# Patient Record
Sex: Female | Born: 1965 | Race: White | Hispanic: No | Marital: Married
Health system: Southern US, Community
[De-identification: ages and names within clinical notes are randomized; demographics above are authoritative.]

---

## 2004-05-23 ENCOUNTER — Ambulatory Visit: Payer: Self-pay | Admitting: Unknown Physician Specialty

## 2004-05-30 ENCOUNTER — Ambulatory Visit: Payer: Self-pay | Admitting: Unknown Physician Specialty

## 2005-12-04 ENCOUNTER — Ambulatory Visit: Payer: Self-pay | Admitting: Unknown Physician Specialty

## 2006-12-12 ENCOUNTER — Ambulatory Visit: Payer: Self-pay | Admitting: Unknown Physician Specialty

## 2008-01-15 ENCOUNTER — Ambulatory Visit: Payer: Self-pay | Admitting: Unknown Physician Specialty

## 2009-03-03 ENCOUNTER — Ambulatory Visit: Payer: Self-pay | Admitting: Unknown Physician Specialty

## 2010-06-13 ENCOUNTER — Ambulatory Visit: Payer: Self-pay | Admitting: Unknown Physician Specialty

## 2010-11-28 ENCOUNTER — Ambulatory Visit: Payer: Self-pay | Admitting: Unknown Physician Specialty

## 2011-07-23 ENCOUNTER — Ambulatory Visit: Payer: Self-pay | Admitting: Unknown Physician Specialty

## 2012-07-01 ENCOUNTER — Ambulatory Visit: Payer: Self-pay | Admitting: Internal Medicine

## 2012-07-29 ENCOUNTER — Ambulatory Visit: Payer: Self-pay | Admitting: Internal Medicine

## 2014-06-23 ENCOUNTER — Ambulatory Visit: Payer: Self-pay | Admitting: Internal Medicine

## 2015-07-28 ENCOUNTER — Other Ambulatory Visit: Payer: Self-pay | Admitting: Internal Medicine

## 2015-07-28 DIAGNOSIS — Z1231 Encounter for screening mammogram for malignant neoplasm of breast: Secondary | ICD-10-CM

## 2015-08-08 ENCOUNTER — Ambulatory Visit
Admission: RE | Admit: 2015-08-08 | Discharge: 2015-08-08 | Disposition: A | Discharge status: Home / Self Care | Payer: BLUE CROSS/BLUE SHIELD | Source: Ambulatory Visit | Attending: Internal Medicine | Admitting: Internal Medicine

## 2015-08-08 DIAGNOSIS — Z1231 Encounter for screening mammogram for malignant neoplasm of breast: Secondary | ICD-10-CM | POA: Diagnosis not present

## 2016-09-12 ENCOUNTER — Other Ambulatory Visit: Payer: Self-pay | Admitting: Internal Medicine

## 2016-09-12 DIAGNOSIS — Z1231 Encounter for screening mammogram for malignant neoplasm of breast: Secondary | ICD-10-CM

## 2016-10-09 ENCOUNTER — Ambulatory Visit
Admission: RE | Admit: 2016-10-09 | Discharge: 2016-10-09 | Disposition: A | Discharge status: Home / Self Care | Payer: BLUE CROSS/BLUE SHIELD | Source: Ambulatory Visit | Attending: Internal Medicine | Admitting: Internal Medicine

## 2016-10-09 DIAGNOSIS — Z1231 Encounter for screening mammogram for malignant neoplasm of breast: Secondary | ICD-10-CM

## 2017-01-02 ENCOUNTER — Encounter: Payer: Self-pay | Admitting: Emergency Medicine

## 2017-01-02 ENCOUNTER — Emergency Department
Admission: EM | Admit: 2017-01-02 | Discharge: 2017-01-02 | Disposition: A | Discharge status: Home / Self Care | Payer: BLUE CROSS/BLUE SHIELD | Attending: Emergency Medicine | Admitting: Emergency Medicine

## 2017-01-02 DIAGNOSIS — Z203 Contact with and (suspected) exposure to rabies: Secondary | ICD-10-CM

## 2017-01-02 NOTE — ED Provider Notes (Signed)
[facility] Emergency Department Provider Note   ____________________________________________   First MD Initiated Contact with Patient 01/02/17 [phone]     (approximate)  I have reviewed the triage vital signs and the nursing notes.   HISTORY  Chief Complaint Rabies Injection    HPI Melanie Ford is a 51 y.o. female patient to ED for consideration for rabies vaccine. Patient stated there was to best finding house last night. Patient states she does not be she was bitten. Patient's status. Conflicting reports from family members and PCP about taking rabies vaccine. Patient does not want to take the vaccine unless absolutely necessary at this time.   History reviewed. No pertinent past medical history.  There are no active problems to display for this patient.   History reviewed. No pertinent surgical history.  Prior to Admission medications   Not on File    Allergies Patient has no known allergies.  Family History  Problem Relation Age of Onset  . Breast cancer Maternal Aunt     Social History Social History  Substance Use Topics  . Smoking status: Not on file  . Smokeless tobacco: Not on file  . Alcohol use Not on file    Review of Systems  Constitutional: No fever/chills Eyes: No visual changes. ENT: No sore throat. Cardiovascular: Denies chest pain. Respiratory: Denies shortness of breath. Gastrointestinal: No abdominal pain.  No nausea, no vomiting.  No diarrhea.  No constipation. Genitourinary: Negative for dysuria. Musculoskeletal: Negative for back pain. Skin: Negative for rash. Neurological: Negative for headaches, focal weakness or numbness.   ____________________________________________   PHYSICAL EXAM:  VITAL SIGNS: ED Triage Vitals  Enc Vitals Group     BP 01/02/17 0706 109/68     Pulse Rate 01/02/17 0706 63     Resp 01/02/17 0706 17     Temp 01/02/17 0706 98.8 F (37.1 C)     Temp Source 01/02/17 0706 Oral      SpO2 01/02/17 0706 98 %     Weight 01/02/17 0705 150 lb (68 kg)     Height 01/02/17 0705 5\' 8"  (1.727 m)     Head Circumference --      Peak Flow --      Pain Score --      Pain Loc --      Pain Edu? --      Excl. in GC? --     Constitutional: Alert and oriented. Well appearing and in no acute distress. Eyes: Conjunctivae are normal. PERRL. EOMI. Head: Atraumatic. Nose: No congestion/rhinnorhea. Mouth/Throat: Mucous membranes are moist.  Oropharynx non-erythematous. Neck: No stridor.   Hematological/Lymphatic/Immunilogical: No cervical lymphadenopathy. Cardiovascular: Normal rate, regular rhythm. Grossly normal heart sounds.  Good peripheral circulation. Respiratory: Normal respiratory effort.  No retractions. Lungs CTAB. Musculoskeletal: No lower extremity tenderness nor edema.  No joint effusions. Neurologic:  Normal speech and language. No gross focal neurologic deficits are appreciated. No gait instability. Skin:  Skin is warm, dry and intact. No rash noted. Psychiatric: Mood and affect are normal. Speech and behavior are normal.  ____________________________________________   LABS (all labs ordered are listed, but only abnormal results are displayed)  Labs Reviewed - No data to display ____________________________________________  EKG   ____________________________________________  RADIOLOGY  No results found.  ____________________________________________   PROCEDURES  Procedure(s) performed: None  Procedures  Critical Care performed: No  ____________________________________________   INITIAL IMPRESSION / ASSESSMENT AND PLAN / ED COURSE  Pertinent labs & imaging results that were  available during my care of the patient were reviewed by me and considered in my medical decision making (see chart for details).  Discussed with patient that she is low risk for rabies. Advised mother to health condition for the next week and return by the ED if any  signs of illness develop. Patient is amenable to these recommendations at this time.      ____________________________________________   FINAL CLINICAL IMPRESSION(S) / ED DIAGNOSES  Final diagnoses:  Rabies exposure      NEW MEDICATIONS STARTED DURING THIS VISIT:  New Prescriptions   No medications on file     Note:  This document was prepared using Dragon voice recognition software and may include unintentional dictation errors.    Joni ReiningSmith, Yaneliz Radebaugh K, PA-C 01/02/17 [id number]    Minna AntisPaduchowski, Kevin, MD 01/02/17 [phone]

## 2017-01-02 NOTE — Discharge Instructions (Signed)
Advised to monitor health condition from the next 7-10 days. Report back immediately to the ED if any illness develop .

## 2017-01-02 NOTE — ED Triage Notes (Signed)
Patient to ER for rabies vaccine. Patient does not believe she was bitten, but has bats in attic, had two flying in house last night.  

## 2017-09-13 ENCOUNTER — Other Ambulatory Visit: Payer: Self-pay | Admitting: Internal Medicine

## 2017-09-13 DIAGNOSIS — Z1231 Encounter for screening mammogram for malignant neoplasm of breast: Secondary | ICD-10-CM

## 2017-10-10 ENCOUNTER — Ambulatory Visit
Admission: RE | Admit: 2017-10-10 | Discharge: 2017-10-10 | Disposition: A | Discharge status: Home / Self Care | Payer: BLUE CROSS/BLUE SHIELD | Source: Ambulatory Visit | Attending: Internal Medicine | Admitting: Internal Medicine

## 2017-10-10 DIAGNOSIS — Z1231 Encounter for screening mammogram for malignant neoplasm of breast: Secondary | ICD-10-CM

## 2018-07-14 IMAGING — MG MM DIGITAL SCREENING BILAT W/ CAD
4 series · 4 of 4 positions shown · non-contrast
Comparison: Previous exam(s).

CLINICAL DATA: Screening.

EXAM:
DIGITAL SCREENING BILATERAL MAMMOGRAM WITH CAD

[L CC]
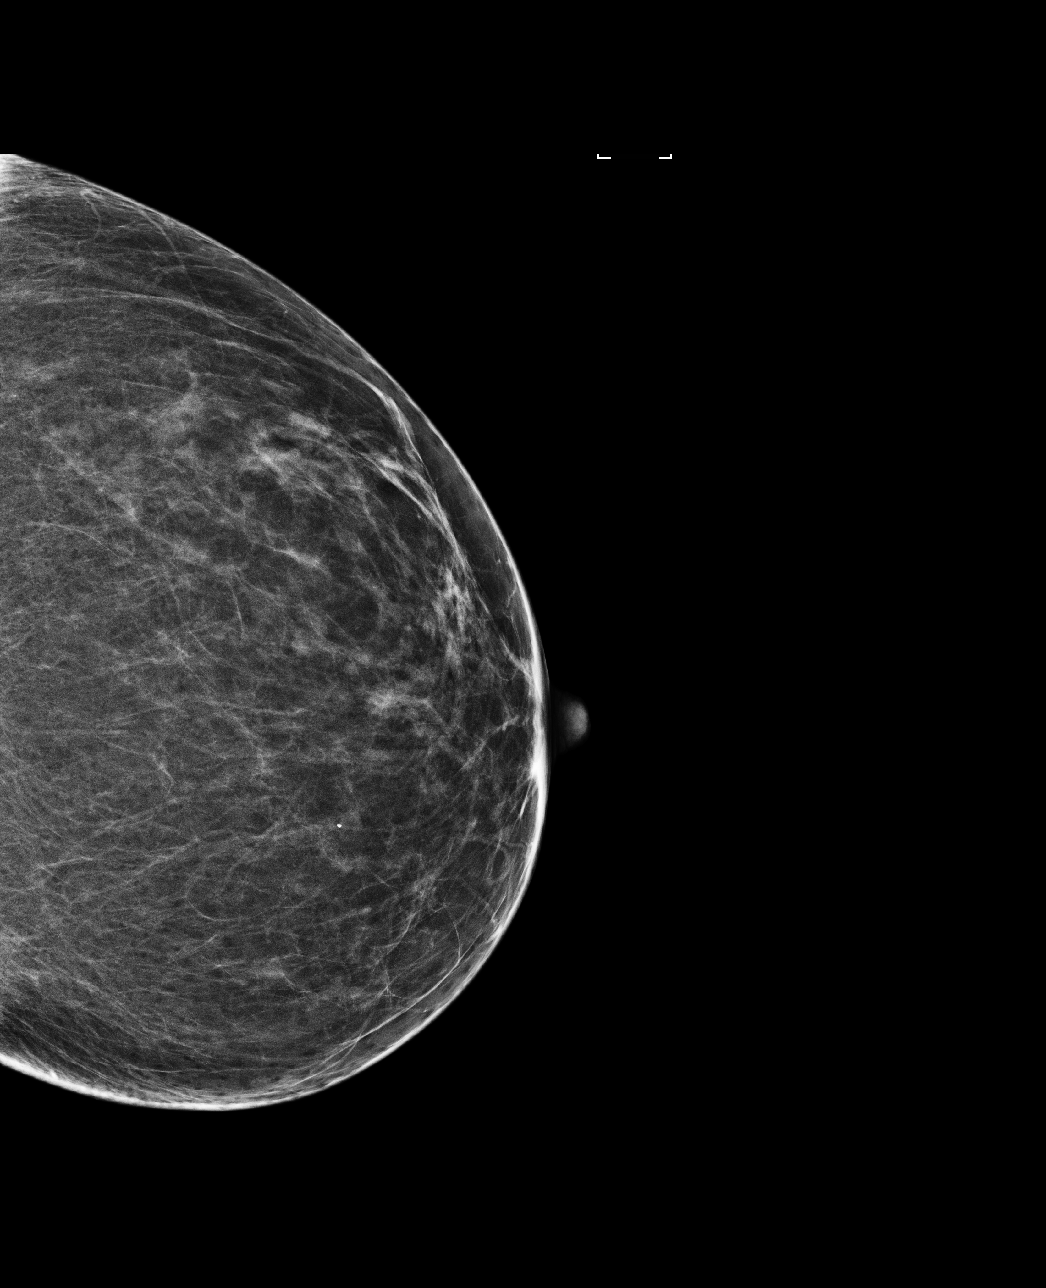

[R MLO]
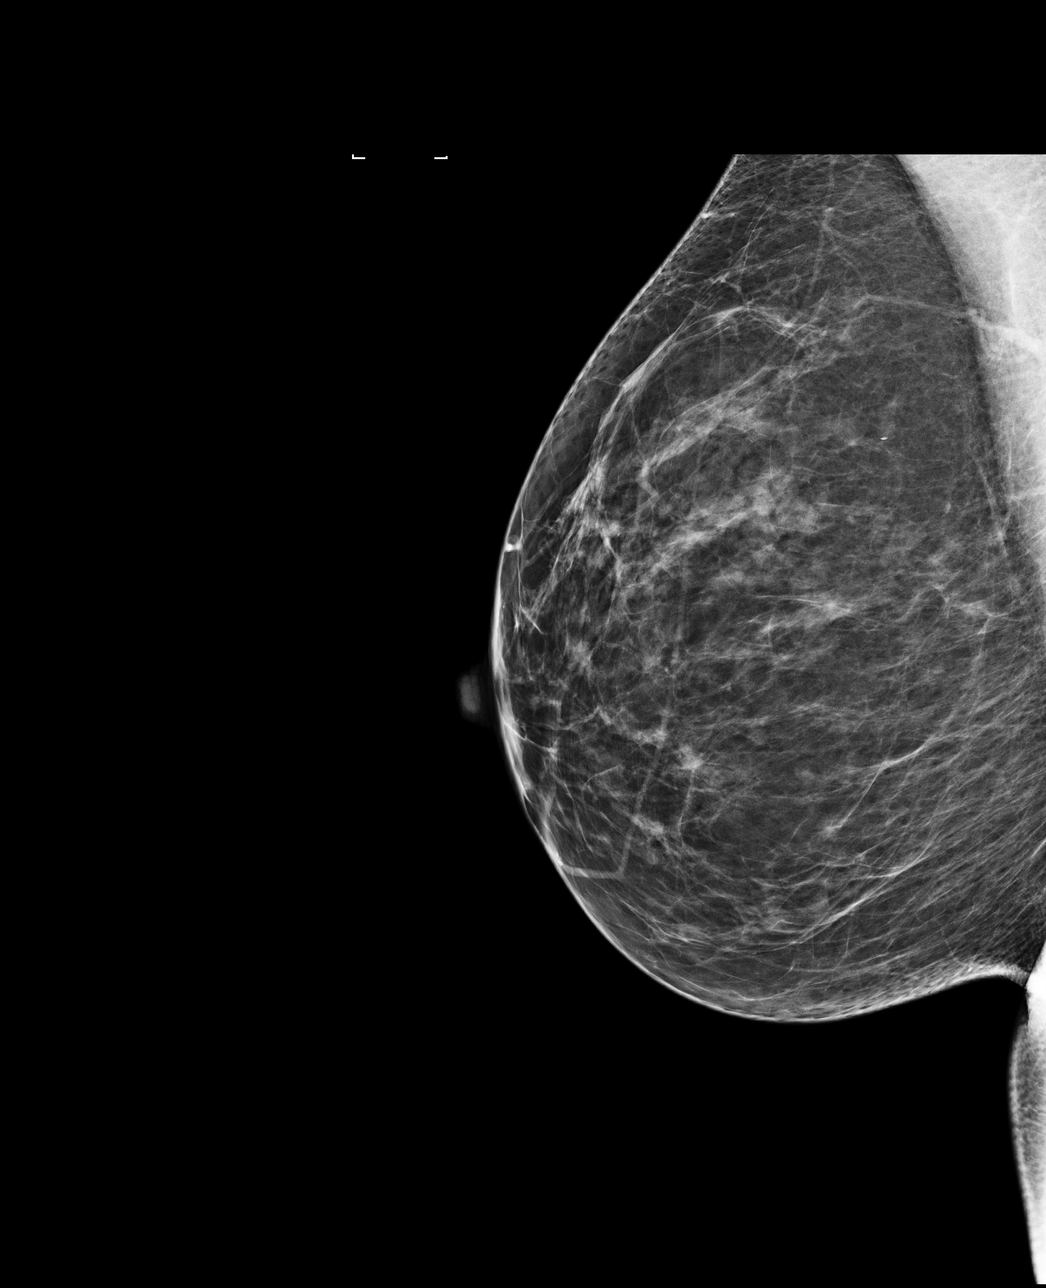

[R CC]
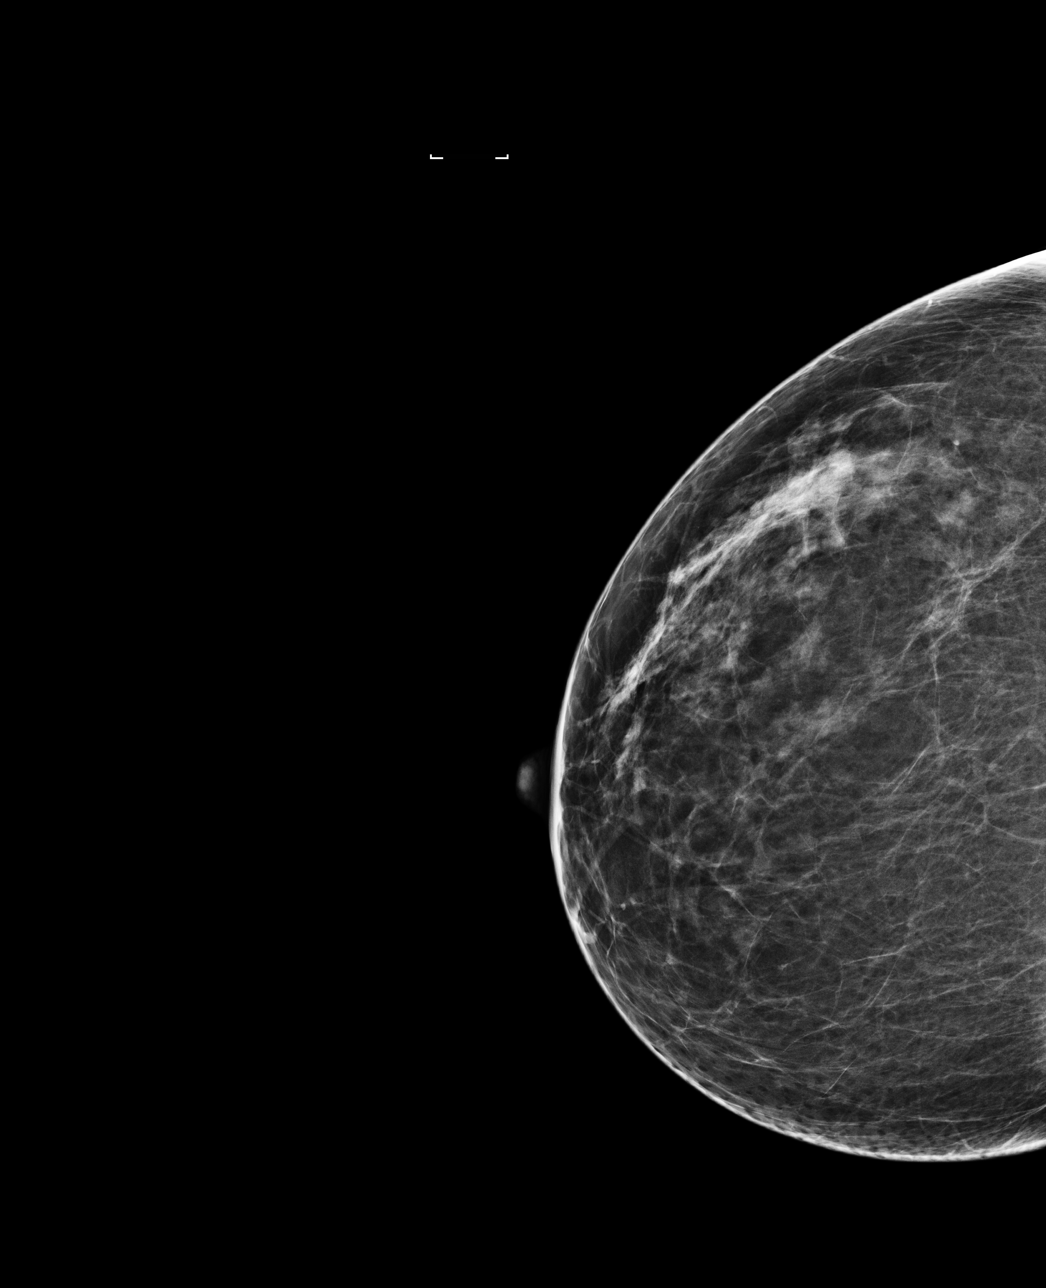

[L MLO]
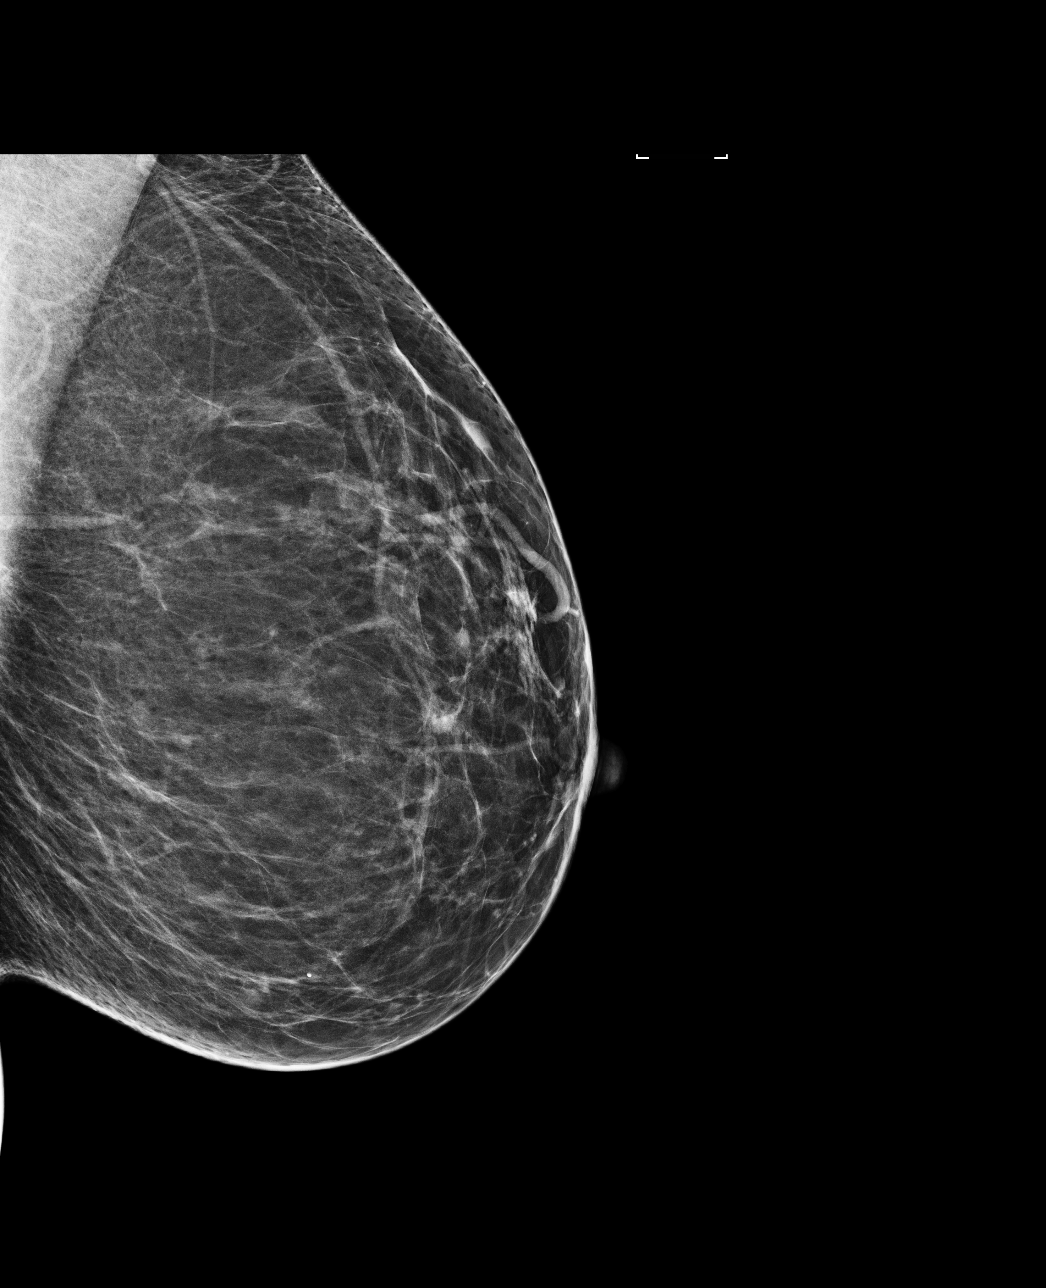

[4 of 4 positions shown; findings below may reference images not displayed]

ACR Breast Density Category c: The breast tissue is heterogeneously
dense, which may obscure small masses.
FINDINGS: There are no findings suspicious for malignancy. Images were
processed with CAD.
IMPRESSION: No mammographic evidence of malignancy. A result letter of this
screening mammogram will be mailed directly to the patient.

RECOMMENDATION:
Screening mammogram in one year. (Code:[id number])

BI-RADS CATEGORY  1: Negative.

## 2018-11-06 ENCOUNTER — Other Ambulatory Visit: Payer: Self-pay | Admitting: Internal Medicine

## 2018-11-06 DIAGNOSIS — Z1231 Encounter for screening mammogram for malignant neoplasm of breast: Secondary | ICD-10-CM

## 2018-12-22 ENCOUNTER — Other Ambulatory Visit: Payer: Self-pay

## 2018-12-22 ENCOUNTER — Ambulatory Visit
Admission: RE | Admit: 2018-12-22 | Discharge: 2018-12-22 | Disposition: A | Discharge status: Home / Self Care | Payer: BC Managed Care – PPO | Source: Ambulatory Visit | Attending: Internal Medicine | Admitting: Internal Medicine

## 2018-12-22 DIAGNOSIS — Z1231 Encounter for screening mammogram for malignant neoplasm of breast: Secondary | ICD-10-CM | POA: Insufficient documentation

## 2019-07-23 ENCOUNTER — Ambulatory Visit: Payer: BC Managed Care – PPO | Attending: Internal Medicine

## 2019-07-23 DIAGNOSIS — Z20822 Contact with and (suspected) exposure to covid-19: Secondary | ICD-10-CM

## 2019-07-24 LAB — NOVEL CORONAVIRUS, NAA: SARS-CoV-2, NAA: NOT DETECTED

## 2020-01-05 ENCOUNTER — Other Ambulatory Visit: Payer: Self-pay

## 2020-01-05 ENCOUNTER — Ambulatory Visit (INDEPENDENT_AMBULATORY_CARE_PROVIDER_SITE_OTHER): Payer: Self-pay | Admitting: Dermatology

## 2020-01-05 DIAGNOSIS — L988 Other specified disorders of the skin and subcutaneous tissue: Secondary | ICD-10-CM

## 2020-01-05 NOTE — Progress Notes (Signed)
   Follow-Up Visit   Subjective  Melanie Ford is a 54 y.o. female who presents for the following: Facial Elastosis (Face, pt presents for Botox today).  The following portions of the chart were reviewed this encounter and updated as appropriate:  Allergies  Meds  Problems  Med Hx  Surg Hx  Fam Hx     Review of Systems:  No other skin or systemic complaints except as noted in HPI or Assessment and Plan.  Objective  Well appearing patient in no apparent distress; mood and affect are within normal limits.  A focused examination was performed including face. Relevant physical exam findings are noted in the Assessment and Plan.  Objective  Head - Anterior (Face): Rhytides and volume loss.   Images                   Assessment & Plan    Elastosis of skin Head - Anterior (Face)  Botox 50 units injected today as marked: - Frown Complex 27.5 units - Forehead 7.5 units - Crows Feet 7.5 units per side total of 15 units  Botox Injection - Head - Anterior (Face) Location: Frown Complex, crows feet, and forehead  Informed consent: Discussed risks (infection, pain, bleeding, bruising, swelling, allergic reaction, paralysis of nearby muscles, eyelid droop, double vision, neck weakness, difficulty breathing, headache, undesirable cosmetic result, and need for additional treatment) and benefits of the procedure, as well as the alternatives.  Informed consent was obtained.  Preparation: The area was cleansed with alcohol.  Procedure Details:  Botox was injected into the dermis with a 30-gauge needle. Pressure applied to any bleeding. Ice packs offered for swelling.  Lot Number:  [id number] Expiration:  04/2022  Total Units Injected:  50 units  Plan: Patient was instructed to remain upright for 4 hours. Patient was instructed to avoid massaging the face and avoid vigorous exercise for the rest of the day. Tylenol may be used for headache.  Allow 2 weeks before returning  to clinic for additional dosing as needed. Patient will call for any problems.   Return in about 3 months (around 04/06/2020) for 3-4 months for Botox.   I, Ardis Rowan, RMA, am acting as scribe for Armida Sans, MD .  Documentation: I have reviewed the above documentation for accuracy and completeness, and I agree with the above.  Armida Sans, MD

## 2020-01-06 ENCOUNTER — Encounter: Payer: Self-pay | Admitting: Dermatology

## 2020-02-19 ENCOUNTER — Other Ambulatory Visit: Payer: Self-pay | Admitting: Internal Medicine

## 2020-02-19 DIAGNOSIS — Z1231 Encounter for screening mammogram for malignant neoplasm of breast: Secondary | ICD-10-CM

## 2020-03-10 ENCOUNTER — Other Ambulatory Visit: Payer: Self-pay

## 2020-03-10 ENCOUNTER — Ambulatory Visit
Admission: RE | Admit: 2020-03-10 | Discharge: 2020-03-10 | Disposition: A | Discharge status: Home / Self Care | Payer: BC Managed Care – PPO | Source: Ambulatory Visit | Attending: Internal Medicine | Admitting: Internal Medicine

## 2020-03-10 DIAGNOSIS — Z1231 Encounter for screening mammogram for malignant neoplasm of breast: Secondary | ICD-10-CM | POA: Diagnosis present

## 2020-05-10 ENCOUNTER — Other Ambulatory Visit: Payer: Self-pay

## 2020-05-10 ENCOUNTER — Ambulatory Visit (INDEPENDENT_AMBULATORY_CARE_PROVIDER_SITE_OTHER): Payer: Self-pay | Admitting: Dermatology

## 2020-05-10 DIAGNOSIS — L988 Other specified disorders of the skin and subcutaneous tissue: Secondary | ICD-10-CM

## 2020-05-10 NOTE — Progress Notes (Signed)
   Follow-Up Visit   Subjective  Melanie Ford is a 54 y.o. female who presents for the following: Facial Elastosis (face, pt presents for botox, last treatment 01/23/20).  The following portions of the chart were reviewed this encounter and updated as appropriate:  Allergies  Meds  Problems  Med Hx  Surg Hx  Fam Hx     Review of Systems:  No other skin or systemic complaints except as noted in HPI or Assessment and Plan.  Objective  Well appearing patient in no apparent distress; mood and affect are within normal limits.  A focused examination was performed including face. Relevant physical exam findings are noted in the Assessment and Plan.  Objective  face: Rhytides and volume loss.   Images     Assessment & Plan  Elastosis of skin face  Botox 50 units injected today as marked: - Frown Complex 27.5 units - Forehead 7.5 units - Crows Feet 7.5 units per side total of 15 units  Intralesional injection - face Location: Frown complex, forehead, crow's feet  Informed consent: Discussed risks (infection, pain, bleeding, bruising, swelling, allergic reaction, paralysis of nearby muscles, eyelid droop, double vision, neck weakness, difficulty breathing, headache, undesirable cosmetic result, and need for additional treatment) and benefits of the procedure, as well as the alternatives.  Informed consent was obtained.  Preparation: The area was cleansed with alcohol.  Procedure Details:  Botox was injected into the dermis with a 30-gauge needle. Pressure applied to any bleeding. Ice packs offered for swelling.  Lot Number:  [id number] Expiration:  06/2022  Total Units Injected:  50  Plan: Patient was instructed to remain upright for 4 hours. Patient was instructed to avoid massaging the face and avoid vigorous exercise for the rest of the day. Tylenol may be used for headache.  Allow 2 weeks before returning to clinic for additional dosing as needed. Patient will call for  any problems.   Return for 3-15m Botox.   I, Ardis Rowan, RMA, am acting as scribe for Armida Sans, MD .  Documentation: I have reviewed the above documentation for accuracy and completeness, and I agree with the above.  Armida Sans, MD

## 2020-05-13 ENCOUNTER — Encounter: Payer: Self-pay | Admitting: Dermatology

## 2020-08-23 ENCOUNTER — Other Ambulatory Visit: Payer: Self-pay

## 2020-08-23 MED ORDER — VALACYCLOVIR HCL 1 G PO TABS: 1000.0000 mg | ORAL_TABLET | ORAL | 1 refills | Status: DC

## 2020-08-23 NOTE — Progress Notes (Signed)
Valtrex RF request from [facility]

## 2020-09-06 ENCOUNTER — Ambulatory Visit: Payer: BC Managed Care – PPO | Admitting: Dermatology

## 2020-09-27 ENCOUNTER — Other Ambulatory Visit: Payer: Self-pay

## 2020-09-27 ENCOUNTER — Ambulatory Visit (INDEPENDENT_AMBULATORY_CARE_PROVIDER_SITE_OTHER): Payer: Self-pay | Admitting: Dermatology

## 2020-09-27 ENCOUNTER — Encounter: Payer: Self-pay | Admitting: Dermatology

## 2020-09-27 DIAGNOSIS — L821 Other seborrheic keratosis: Secondary | ICD-10-CM

## 2020-09-27 DIAGNOSIS — L988 Other specified disorders of the skin and subcutaneous tissue: Secondary | ICD-10-CM

## 2020-09-27 NOTE — Progress Notes (Signed)
   Follow-Up Visit   Subjective  Melanie Ford is a 55 y.o. female who presents for the following: Facial Elastosis (Patient is here today for Botox injections). Patient has brown spots on her face that she would like treated today.   The following portions of the chart were reviewed this encounter and updated as appropriate:   Allergies  Meds  Problems  Med Hx  Surg Hx  Fam Hx     Review of Systems:  No other skin or systemic complaints except as noted in HPI or Assessment and Plan.  Objective  Well appearing patient in no apparent distress; mood and affect are within normal limits.  A focused examination was performed including the face. Relevant physical exam findings are noted in the Assessment and Plan.  Objective  Face: Rhytides and volume loss.   Images    Objective  L inf jaw x 4 (4): Stuck-on, waxy, tan-brown papule or plaque --Discussed benign etiology and prognosis.   Assessment & Plan  Elastosis of skin Face  Botox 50 units injected as marked - Frown complex 27.5 units - Crow's feet 7.5 units each  - Forehead 7.5 units   Botox Injection - Face Location: See attached image  Informed consent: Discussed risks (infection, pain, bleeding, bruising, swelling, allergic reaction, paralysis of nearby muscles, eyelid droop, double vision, neck weakness, difficulty breathing, headache, undesirable cosmetic result, and need for additional treatment) and benefits of the procedure, as well as the alternatives.  Informed consent was obtained.  Preparation: The area was cleansed with alcohol.  Procedure Details:  Botox was injected into the dermis with a 30-gauge needle. Pressure applied to any bleeding. Ice packs offered for swelling.  Lot Number:  [id number] Expiration:  11/2022  Total Units Injected:  50  Plan: Patient was instructed to remain upright for 4 hours. Patient was instructed to avoid massaging the face and avoid vigorous exercise for the rest of the  day. Tylenol may be used for headache.  Allow 2 weeks before returning to clinic for additional dosing as needed. Patient will call for any problems.   Seborrheic keratosis (4) L inf jaw x 4  Destruction of lesion - L inf jaw x 4 Complexity: simple   Destruction method: cryotherapy   Informed consent: discussed and consent obtained   Timeout:  patient name, date of birth, surgical site, and procedure verified Lesion destroyed using liquid nitrogen: Yes   Region frozen until ice ball extended beyond lesion: Yes   Outcome: patient tolerated procedure well with no complications   Post-procedure details: wound care instructions given    Return in about 3 months (around 12/27/2020) for Botox injections.  Maylene Roes, CMA, am acting as scribe for Armida Sans, MD .  Documentation: I have reviewed the above documentation for accuracy and completeness, and I agree with the above.  Armida Sans, MD

## 2020-09-27 NOTE — Patient Instructions (Signed)

## 2021-01-24 ENCOUNTER — Ambulatory Visit (INDEPENDENT_AMBULATORY_CARE_PROVIDER_SITE_OTHER): Payer: BC Managed Care – PPO | Admitting: Dermatology

## 2021-01-24 ENCOUNTER — Other Ambulatory Visit: Payer: Self-pay

## 2021-01-24 ENCOUNTER — Encounter: Payer: Self-pay | Admitting: Dermatology

## 2021-01-24 DIAGNOSIS — L988 Other specified disorders of the skin and subcutaneous tissue: Secondary | ICD-10-CM

## 2021-01-24 DIAGNOSIS — R21 Rash and other nonspecific skin eruption: Secondary | ICD-10-CM

## 2021-01-24 MED ORDER — VTAMA 1 % EX CREA: 1.0000 "application " | TOPICAL_CREAM | Freq: Every day | CUTANEOUS | 3 refills | Status: DC

## 2021-01-24 NOTE — Progress Notes (Signed)
   Follow-Up Visit   Subjective  Melanie Ford is a 55 y.o. female who presents for the following: Facial Elastosis (Botox today) and Rash (Vaginal area - had a biopsy done by Dr. Dalbert Garnet. Treating with Clobetasol and the itching has stopped but the white patch is still there).  The following portions of the chart were reviewed this encounter and updated as appropriate:   Allergies  Meds  Problems  Med Hx  Surg Hx  Fam Hx     Review of Systems:  No other skin or systemic complaints except as noted in HPI or Assessment and Plan.  Objective  Well appearing patient in no apparent distress; mood and affect are within normal limits.  A focused examination was performed including face. Relevant physical exam findings are noted in the Assessment and Plan.  Head - Anterior (Face) Rhytides and volume loss.   Vaginal area 1.0 x 1.5 cm hyperkeratotic white plaque of the left inferior labia minora    Assessment & Plan  Elastosis of skin Head - Anterior (Face)  Botox today - 50 units   27.5 units frown complex 7.5 units forehead 7.5 units crow's feet (each side)  Filling material injection - Head - Anterior (Face) Location: See attached image  Informed consent: Discussed risks (infection, pain, bleeding, bruising, swelling, allergic reaction, paralysis of nearby muscles, eyelid droop, double vision, neck weakness, difficulty breathing, headache, undesirable cosmetic result, and need for additional treatment) and benefits of the procedure, as well as the alternatives.  Informed consent was obtained.  Preparation: The area was cleansed with alcohol.  Procedure Details:  Botox was injected into the dermis with a 30-gauge needle. Pressure applied to any bleeding. Ice packs offered for swelling.  Lot Number:  [id number] Expiration:  11/2022  Total Units Injected:  50  Plan: Patient was instructed to remain upright for 4 hours. Patient was instructed to avoid massaging the face and  avoid vigorous exercise for the rest of the day. Tylenol may be used for headache.  Allow 2 weeks before returning to clinic for additional dosing as needed. Patient will call for any problems.   Rash -biopsy-proven psoriasiform and spongiotic dermatitis. Positive family history of psoriasis. Personal history of hand dermatitis and "eczema" There may also be an element of lichen simplex chronicus  Vaginal area Chronic and persistent Possible psoriasis - Biopsied by Dr. Dalbert Garnet - Positive family history of psoriasis in her father.  Psoriasis is a chronic non-curable, but treatable genetic/hereditary disease that may have other systemic features affecting other organ systems such as joints (Psoriatic Arthritis). It is associated with an increased risk of inflammatory bowel disease, heart disease, non-alcoholic fatty liver disease, and depression.    Discontinue Clobetasol. May restart if itching flares. Start Vtama cream qd - samples given today May consider intralesional steroid injection for the hyperkeratotic area if necessary in the future.  Tapinarof (VTAMA) 1 % CREA - Vaginal area Apply 1 application topically daily.  Return for 6-8 week for psoriasis, Botox in 3-4 months.   I, Joanie Coddington, CMA, am acting as scribe for Armida Sans, MD . Documentation: I have reviewed the above documentation for accuracy and completeness, and I agree with the above.  Armida Sans, MD

## 2021-01-24 NOTE — Patient Instructions (Signed)

## 2021-03-14 ENCOUNTER — Ambulatory Visit: Payer: BC Managed Care – PPO | Admitting: Dermatology

## 2021-03-14 ENCOUNTER — Other Ambulatory Visit: Payer: Self-pay

## 2021-03-14 DIAGNOSIS — Z840 Family history of diseases of the skin and subcutaneous tissue: Secondary | ICD-10-CM | POA: Diagnosis not present

## 2021-03-14 DIAGNOSIS — L308 Other specified dermatitis: Secondary | ICD-10-CM | POA: Diagnosis not present

## 2021-03-14 DIAGNOSIS — R21 Rash and other nonspecific skin eruption: Secondary | ICD-10-CM | POA: Diagnosis not present

## 2021-03-14 NOTE — Patient Instructions (Signed)

## 2021-03-14 NOTE — Progress Notes (Signed)
   Follow-Up Visit   Subjective  Melanie Ford is a 55 y.o. female who presents for the following: Psoriasis (4 weeks f/u psoriasis in the vaginal area treating with Vtama with a poor response, past treatment Clobetasol cream not much help ).  The following portions of the chart were reviewed this encounter and updated as appropriate:   Allergies  Meds  Problems  Med Hx  Surg Hx  Fam Hx     Review of Systems:  No other skin or systemic complaints except as noted in HPI or Assessment and Plan.  Objective  Well appearing patient in no apparent distress; mood and affect are within normal limits.  A focused examination was performed including face, vaginal area. Relevant physical exam findings are noted in the Assessment and Plan.  vaginal area 1.7 x 1.2cm hyperkeratotic white plaque of the left inferior medial labia minora   Assessment & Plan  Rash vaginal area Rash -biopsy-proven psoriasiform and spongiotic dermatitis. Positive family history of psoriasis. Personal history of hand dermatitis and "eczema" There may also be an element of lichen simplex chronicus  Chronic and persistent Possible psoriasis - Biopsied by Dr. Dalbert Garnet - Positive family history of psoriasis in her father.   Psoriasis is a chronic non-curable, but treatable genetic/hereditary disease that may have other systemic features affecting other organ systems such as joints (Psoriatic Arthritis). It is associated with an increased risk of inflammatory bowel disease, heart disease, non-alcoholic fatty liver disease, and depression.    Discussed treatment options ILK injections or we could remove the thicken area by shave removal today.  Pt request ILK injections   ILK 4 mg / ml Kenalog 10 mg / ml diluted with buffered lidocaine and Bupivacaine  D/C Vtama samples  Samples of Wynzora given use qd-bid prn  Intralesional injection - vaginal area Location: vaginal area   Informed Consent: Discussed risks  (infection, pain, bleeding, bruising, thinning of the skin, loss of skin pigment, lack of resolution, and recurrence of lesion) and benefits of the procedure, as well as the alternatives. Informed consent was obtained. Preparation: The area was prepared a standard fashion.  Procedure Details: An intralesional injection was performed with Kenalog 4 mg/cc. 0.4 cc in total were injected.  Total number of injections: 4  Plan: The patient was instructed on post-op care. Recommend OTC analgesia as needed for pain.  Return in about 6 weeks (around 04/25/2021) for rash.  IAngelique Holm, CMA, am acting as scribe for Armida Sans, MD .  Documentation: I have reviewed the above documentation for accuracy and completeness, and I agree with the above.  Armida Sans, MD

## 2021-03-17 ENCOUNTER — Encounter: Payer: Self-pay | Admitting: Dermatology

## 2021-04-25 ENCOUNTER — Other Ambulatory Visit: Payer: Self-pay | Admitting: Internal Medicine

## 2021-04-25 DIAGNOSIS — Z1231 Encounter for screening mammogram for malignant neoplasm of breast: Secondary | ICD-10-CM

## 2021-04-26 ENCOUNTER — Other Ambulatory Visit: Payer: Self-pay

## 2021-04-26 ENCOUNTER — Ambulatory Visit: Payer: BC Managed Care – PPO | Admitting: Dermatology

## 2021-04-26 DIAGNOSIS — L308 Other specified dermatitis: Secondary | ICD-10-CM | POA: Diagnosis not present

## 2021-04-26 DIAGNOSIS — Z840 Family history of diseases of the skin and subcutaneous tissue: Secondary | ICD-10-CM

## 2021-04-26 DIAGNOSIS — R21 Rash and other nonspecific skin eruption: Secondary | ICD-10-CM

## 2021-04-26 DIAGNOSIS — L408 Other psoriasis: Secondary | ICD-10-CM | POA: Diagnosis not present

## 2021-04-26 NOTE — Progress Notes (Signed)
   Follow-Up Visit   Subjective  Melanie Ford is a 55 y.o. female who presents for the following: Rash (2 months f/u rash/psoriasis in the vagina area, much improved, past treatment ILK injections helped, pt report no itching or irritation, treating with samples of Wynzora cream with a good response ).  The following portions of the chart were reviewed this encounter and updated as appropriate:   Allergies  Meds  Problems  Med Hx  Surg Hx  Fam Hx     Review of Systems:  No other skin or systemic complaints except as noted in HPI or Assessment and Plan.  Objective  Well appearing patient in no apparent distress; mood and affect are within normal limits.  A focused examination was performed including vagina. Relevant physical exam findings are noted in the Assessment and Plan.  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 Positive family history of psoriasis. Personal history of hand dermatitis and "eczema" There may also be an element of lichen simplex chronicus  Chronic and persistent - Biopsied by Dr. Dalbert Garnet - Positive family history of psoriasis in her father.   Start Zoryve cream qhs Continue Wynzora in am  Psoriasis is a chronic non-curable, but treatable genetic/hereditary disease that may have other systemic features affecting other organ systems such as joints (Psoriatic Arthritis). It is associated with an increased risk of inflammatory bowel disease, heart disease, non-alcoholic fatty liver disease, and depression.    Sample of Zoryve apply to skin at bedtime Cont samples of Wynzora cream apply to skin in the morning   Return if symptoms worsen or fail to improve.  IAngelique Holm, CMA, am acting as scribe  for Armida Sans, MD .  Documentation: I have reviewed the above documentation for accuracy and completeness, and I agree with the above.  Armida Sans, MD

## 2021-04-26 NOTE — Patient Instructions (Signed)

## 2021-04-27 ENCOUNTER — Encounter: Payer: Self-pay | Admitting: Dermatology

## 2021-05-16 ENCOUNTER — Ambulatory Visit
Admission: RE | Admit: 2021-05-16 | Discharge: 2021-05-16 | Disposition: A | Discharge status: Home / Self Care | Payer: BC Managed Care – PPO | Source: Ambulatory Visit | Attending: Internal Medicine | Admitting: Internal Medicine

## 2021-05-16 ENCOUNTER — Other Ambulatory Visit: Payer: Self-pay

## 2021-05-16 DIAGNOSIS — Z1231 Encounter for screening mammogram for malignant neoplasm of breast: Secondary | ICD-10-CM | POA: Insufficient documentation

## 2021-07-04 ENCOUNTER — Ambulatory Visit (INDEPENDENT_AMBULATORY_CARE_PROVIDER_SITE_OTHER): Payer: Self-pay | Admitting: Dermatology

## 2021-07-04 ENCOUNTER — Encounter: Payer: Self-pay | Admitting: Dermatology

## 2021-07-04 ENCOUNTER — Other Ambulatory Visit: Payer: Self-pay

## 2021-07-04 DIAGNOSIS — R21 Rash and other nonspecific skin eruption: Secondary | ICD-10-CM

## 2021-07-04 DIAGNOSIS — L988 Other specified disorders of the skin and subcutaneous tissue: Secondary | ICD-10-CM

## 2021-07-04 DIAGNOSIS — L408 Other psoriasis: Secondary | ICD-10-CM

## 2021-07-04 NOTE — Progress Notes (Signed)
° °  Follow-Up Visit   Subjective  Melanie Ford is a 56 y.o. female who presents for the following: Facial Elastosis (Here for Botox). Her rash of labia has cleared up.  The following portions of the chart were reviewed this encounter and updated as appropriate:  Allergies   Meds   Problems   Med Hx   Surg Hx   Fam Hx      Review of Systems: No other skin or systemic complaints except as noted in HPI or Assessment and Plan.   Objective  Well appearing patient in no apparent distress; mood and affect are within normal limits.  A focused examination was performed including face. Relevant physical exam findings are noted in the Assessment and Plan.  face Rhytides and volume loss.       Assessment & Plan  Elastosis of skin face  Botox today - 50 units    27.5 units frown complex 7.5 units forehead 7.5 units crow's feet (each side)  Botox Injection - face Location: See attached image  Informed consent: Discussed risks (infection, pain, bleeding, bruising, swelling, allergic reaction, paralysis of nearby muscles, eyelid droop, double vision, neck weakness, difficulty breathing, headache, undesirable cosmetic result, and need for additional treatment) and benefits of the procedure, as well as the alternatives.  Informed consent was obtained.  Preparation: The area was cleansed with alcohol.  Procedure Details:  Botox was injected into the dermis with a 30-gauge needle. Pressure applied to any bleeding. Ice packs offered for swelling.  Lot Number:  [id number] Expiration:  07/2023  Total Units Injected:  50  Plan: Patient was instructed to remain upright for 4 hours. Patient was instructed to avoid massaging the face and avoid vigorous exercise for the rest of the day. Tylenol may be used for headache.  Allow 2 weeks before returning to clinic for additional dosing as needed. Patient will call for any problems.   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 She was also given Zoryve cream last visit to use nightly as needed. She says she has not had to use these as is clear. She may restart these as needed if recurs.  Return in about 4 months (around 11/01/2021) for Botox.  I, Lawson Radar, CMA, am acting as scribe for Armida Sans, MD. Documentation: I have reviewed the above documentation for accuracy and completeness, and I agree with the above.  Armida Sans, MD

## 2021-07-04 NOTE — Patient Instructions (Signed)

## 2021-07-06 ENCOUNTER — Encounter: Payer: Self-pay | Admitting: Dermatology

## 2021-11-07 ENCOUNTER — Ambulatory Visit (INDEPENDENT_AMBULATORY_CARE_PROVIDER_SITE_OTHER): Payer: Self-pay | Admitting: Dermatology

## 2021-11-07 DIAGNOSIS — L821 Other seborrheic keratosis: Secondary | ICD-10-CM

## 2021-11-07 DIAGNOSIS — L988 Other specified disorders of the skin and subcutaneous tissue: Secondary | ICD-10-CM

## 2021-11-07 NOTE — Patient Instructions (Addendum)

## 2021-11-07 NOTE — Progress Notes (Signed)
   Follow-Up Visit   Subjective  Melanie Ford is a 56 y.o. female who presents for the following: Facial Elastosis (Pt here for Botox ) and Skin Problem (Remove brown spots off her face and neck ).  The following portions of the chart were reviewed this encounter and updated as appropriate:   Allergies  Meds  Problems  Med Hx  Surg Hx  Fam Hx     Review of Systems:  No other skin or systemic complaints except as noted in HPI or Assessment and Plan.  Objective  Well appearing patient in no apparent distress; mood and affect are within normal limits.  A focused examination was performed including face,neck,chest . Relevant physical exam findings are noted in the Assessment and Plan.  face Rhytides and volume loss.      neck x 3, face x 12  (15) (15) Stuck-on, waxy, tan-brown papules --Discussed benign etiology and prognosis.    Assessment & Plan  Elastosis of skin face  Botox 50 units   Frown complex 27.5 units Forehead 7.5 units  Crow's feet 7.5  x 2   Intralesional injection - face Location: See attached image  Informed consent: Discussed risks (infection, pain, bleeding, bruising, swelling, allergic reaction, paralysis of nearby muscles, eyelid droop, double vision, neck weakness, difficulty breathing, headache, undesirable cosmetic result, and need for additional treatment) and benefits of the procedure, as well as the alternatives.  Informed consent was obtained.  Preparation: The area was cleansed with alcohol.  Procedure Details:  Botox was injected into the dermis with a 30-gauge needle. Pressure applied to any bleeding. Ice packs offered for swelling.  Lot Number:  [id number] Expiration:  09/24/2023  Total Units Injected:  50  Plan: Patient was instructed to remain upright for 4 hours. Patient was instructed to avoid massaging the face and avoid vigorous exercise for the rest of the day. Tylenol may be used for headache.  Allow 2 weeks before returning to  clinic for additional dosing as needed. Patient will call for any problems.   Seborrheic keratosis (15) neck x 3, face x 12  (15)  Reassured benign age-related growth.  Recommend observation.  Discussed cryotherapy if spot(s) become irritated or inflamed.    Pt will pay out of pocket to remove benign Sks today.  Destruction of lesion - neck x 3, face x 12  (15) Complexity: simple   Destruction method: cryotherapy   Informed consent: discussed and consent obtained   Timeout:  patient name, date of birth, surgical site, and procedure verified Lesion destroyed using liquid nitrogen: Yes   Region frozen until ice ball extended beyond lesion: Yes   Outcome: patient tolerated procedure well with no complications   Post-procedure details: wound care instructions given     Return in about 3 months (around 02/07/2022) for Botox .  IAngelique Holm, CMA, am acting as scribe for Armida Sans, MD .  Documentation: I have reviewed the above documentation for accuracy and completeness, and I agree with the above.  Armida Sans, MD

## 2021-11-18 ENCOUNTER — Encounter: Payer: Self-pay | Admitting: Dermatology

## 2022-01-23 ENCOUNTER — Other Ambulatory Visit: Payer: Self-pay | Admitting: Obstetrics and Gynecology

## 2022-01-23 DIAGNOSIS — Z1231 Encounter for screening mammogram for malignant neoplasm of breast: Secondary | ICD-10-CM

## 2022-02-22 ENCOUNTER — Other Ambulatory Visit: Payer: Self-pay | Admitting: Dermatology

## 2022-03-13 ENCOUNTER — Ambulatory Visit: Payer: BC Managed Care – PPO | Admitting: Dermatology

## 2022-03-27 ENCOUNTER — Ambulatory Visit (INDEPENDENT_AMBULATORY_CARE_PROVIDER_SITE_OTHER): Payer: Self-pay | Admitting: Dermatology

## 2022-03-27 DIAGNOSIS — L988 Other specified disorders of the skin and subcutaneous tissue: Secondary | ICD-10-CM

## 2022-03-27 NOTE — Patient Instructions (Signed)
Due to recent changes in healthcare laws, you may see results of your pathology and/or laboratory studies on MyChart before the doctors have had a chance to review them. We understand that in some cases there may be results that are confusing or concerning to you. Please understand that not all results are received at the same time and often the doctors may need to interpret multiple results in order to provide you with the best plan of care or course of treatment. Therefore, we ask that you please give us 2 business days to thoroughly review all your results before contacting the office for clarification. Should we see a critical lab result, you will be contacted sooner.   If You Need Anything After Your Visit  If you have any questions or concerns for your doctor, please call our main line at [phone] and press option 4 to reach your doctor's medical assistant. If no one answers, please leave a voicemail as directed and we will return your call as soon as possible. Messages left after 4 pm will be answered the following business day.   You may also send us a message via MyChart. We typically respond to MyChart messages within 1-2 business days.  For prescription refills, please ask your pharmacy to contact our office. Our fax number is [phone].  If you have an urgent issue when the clinic is closed that cannot wait until the next business day, you can page your doctor at the number below.    Please note that while we do our best to be available for urgent issues outside of office hours, we are not available 24/7.   If you have an urgent issue and are unable to reach us, you may choose to seek medical care at your doctor's office, retail clinic, urgent care center, or emergency room.  If you have a medical emergency, please immediately call 911 or go to the emergency department.  Pager Numbers  - Dr. Kowalski: [phone]  - Dr. Moye: [phone]  - Dr. Stewart:  [phone]  In the event of inclement weather, please call our main line at [phone] for an update on the status of any delays or closures.  Dermatology Medication Tips: Please keep the boxes that topical medications come in in order to help keep track of the instructions about where and how to use these. Pharmacies typically print the medication instructions only on the boxes and not directly on the medication tubes.   If your medication is too expensive, please contact our office at [phone] option 4 or send us a message through MyChart.   We are unable to tell what your co-pay for medications will be in advance as this is different depending on your insurance coverage. However, we may be able to find a substitute medication at lower cost or fill out paperwork to get insurance to cover a needed medication.   If a prior authorization is required to get your medication covered by your insurance company, please allow us 1-2 business days to complete this process.  Drug prices often vary depending on where the prescription is filled and some pharmacies may offer cheaper prices.  The website www.goodrx.com contains coupons for medications through different pharmacies. The prices here do not account for what the cost may be with help from insurance (it may be cheaper with your insurance), but the website can give you the price if you did not use any insurance.  - You can print the associated coupon and take it with   your prescription to the pharmacy.  - You may also stop by our office during regular business hours and pick up a GoodRx coupon card.  - If you need your prescription sent electronically to a different pharmacy, notify our office through [facility] MyChart or by phone at [phone] option 4.     Si Usted Necesita Algo Despus de Su Visita  Tambin puede enviarnos un mensaje a travs de MyChart. Por lo general respondemos a los mensajes de MyChart en el transcurso de 1 a 2  das hbiles.  Para renovar recetas, por favor pida a su farmacia que se ponga en contacto con nuestra oficina. Nuestro nmero de fax es el [phone].  Si tiene un asunto urgente cuando la clnica est cerrada y que no puede esperar hasta el siguiente da hbil, puede llamar/localizar a su doctor(a) al nmero que aparece a continuacin.   Por favor, tenga en cuenta que aunque hacemos todo lo posible para estar disponibles para asuntos urgentes fuera del horario de oficina, no estamos disponibles las 24 horas del da, los 7 das de la semana.   Si tiene un problema urgente y no puede comunicarse con nosotros, puede optar por buscar atencin mdica  en el consultorio de su doctor(a), en una clnica privada, en un centro de atencin urgente o en una sala de emergencias.  Si tiene una emergencia mdica, por favor llame inmediatamente al 911 o vaya a la sala de emergencias.  Nmeros de bper  - Dr. Kowalski: [phone]  - Dra. Moye: [phone]  - Dra. Stewart: [phone]  En caso de inclemencias del tiempo, por favor llame a nuestra lnea principal al [phone] para una actualizacin sobre el estado de cualquier retraso o cierre.  Consejos para la medicacin en dermatologa: Por favor, guarde las cajas en las que vienen los medicamentos de uso tpico para ayudarle a seguir las instrucciones sobre dnde y cmo usarlos. Las farmacias generalmente imprimen las instrucciones del medicamento slo en las cajas y no directamente en los tubos del medicamento.   Si su medicamento es muy caro, por favor, pngase en contacto con nuestra oficina llamando al [phone] y presione la opcin 4 o envenos un mensaje a travs de MyChart.   No podemos decirle cul ser su copago por los medicamentos por adelantado ya que esto es diferente dependiendo de la cobertura de su seguro. Sin embargo, es posible que podamos encontrar un medicamento sustituto a menor costo o llenar un formulario para que el  seguro cubra el medicamento que se considera necesario.   Si se requiere una autorizacin previa para que su compaa de seguros cubra su medicamento, por favor permtanos de 1 a 2 das hbiles para completar este proceso.  Los precios de los medicamentos varan con frecuencia dependiendo del lugar de dnde se surte la receta y alguna farmacias pueden ofrecer precios ms baratos.  El sitio web www.goodrx.com tiene cupones para medicamentos de diferentes farmacias. Los precios aqu no tienen en cuenta lo que podra costar con la ayuda del seguro (puede ser ms barato con su seguro), pero el sitio web puede darle el precio si no utiliz ningn seguro.  - Puede imprimir el cupn correspondiente y llevarlo con su receta a la farmacia.  - Tambin puede pasar por nuestra oficina durante el horario de atencin regular y recoger una tarjeta de cupones de GoodRx.  - Si necesita que su receta se enve electrnicamente a una farmacia diferente, informe a nuestra oficina a travs de MyChart de [facility]   o por telfono llamando al [phone] y presione la opcin 4.  

## 2022-03-27 NOTE — Progress Notes (Signed)
   Follow-Up Visit   Subjective  Melanie Ford is a 56 y.o. female who presents for the following: Facial Elastosis (Patient is here today for Botox injections).  The following portions of the chart were reviewed this encounter and updated as appropriate:   Allergies  Meds  Problems  Med Hx  Surg Hx  Fam Hx     Review of Systems:  No other skin or systemic complaints except as noted in HPI or Assessment and Plan.  Objective  Well appearing patient in no apparent distress; mood and affect are within normal limits.  A focused examination was performed including the face. Relevant physical exam findings are noted in the Assessment and Plan.  Face Rhytides and volume loss.       Assessment & Plan  Elastosis of skin Face  Botox 50 units injected as marked:  - Frown complex 27.5 units - Forehead 7.5 units  - Crow's feet 7.5  x 2   Botox Injection - Face Location: See attached image  Informed consent: Discussed risks (infection, pain, bleeding, bruising, swelling, allergic reaction, paralysis of nearby muscles, eyelid droop, double vision, neck weakness, difficulty breathing, headache, undesirable cosmetic result, and need for additional treatment) and benefits of the procedure, as well as the alternatives.  Informed consent was obtained.  Preparation: The area was cleansed with alcohol.  Procedure Details:  Botox was injected into the dermis with a 30-gauge needle. Pressure applied to any bleeding. Ice packs offered for swelling.  Lot Number:  [id number] Expiration:  12/25  Total Units Injected:  50  Plan: Patient was instructed to remain upright for 4 hours. Patient was instructed to avoid massaging the face and avoid vigorous exercise for the rest of the day. Tylenol may be used for headache.  Allow 2 weeks before returning to clinic for additional dosing as needed. Patient will call for any problems.    Return in about 4 months (around 07/28/2022) for Botox  injections.  Maylene Roes, CMA, am acting as scribe for Armida Sans, MD . Documentation: I have reviewed the above documentation for accuracy and completeness, and I agree with the above.  Armida Sans, MD

## 2022-03-28 ENCOUNTER — Encounter: Payer: Self-pay | Admitting: Dermatology

## 2022-05-21 ENCOUNTER — Ambulatory Visit
Admission: RE | Admit: 2022-05-21 | Discharge: 2022-05-21 | Disposition: A | Discharge status: Home / Self Care | Payer: BC Managed Care – PPO | Source: Ambulatory Visit | Attending: Obstetrics and Gynecology | Admitting: Obstetrics and Gynecology

## 2022-05-21 DIAGNOSIS — Z1231 Encounter for screening mammogram for malignant neoplasm of breast: Secondary | ICD-10-CM

## 2022-07-31 ENCOUNTER — Ambulatory Visit (INDEPENDENT_AMBULATORY_CARE_PROVIDER_SITE_OTHER): Payer: Self-pay | Admitting: Dermatology

## 2022-07-31 VITALS — BP 101/66 | HR 60

## 2022-07-31 DIAGNOSIS — L988 Other specified disorders of the skin and subcutaneous tissue: Secondary | ICD-10-CM

## 2022-07-31 MED ORDER — TRETINOIN 0.025 % EX CREA: TOPICAL_CREAM | Freq: Every day | CUTANEOUS | 11 refills | Status: AC

## 2022-07-31 NOTE — Progress Notes (Unsigned)
   Follow-Up Visit   Subjective  Melanie Ford is a 57 y.o. female who presents for the following: Facial Elastosis (Face, pt presents for botox today, pt feels like she is getting a crease on right upper eyelid and is more noticeable after getting her botox).  The following portions of the chart were reviewed this encounter and updated as appropriate:   Allergies  Meds  Problems  Med Hx  Surg Hx  Fam Hx     Review of Systems:  No other skin or systemic complaints except as noted in HPI or Assessment and Plan.  Objective  Well appearing patient in no apparent distress; mood and affect are within normal limits.  A focused examination was performed including face. Relevant physical exam findings are noted in the Assessment and Plan.  Head - Anterior (Face) Rhytides and volume loss.       Assessment & Plan  Elastosis of skin Head - Anterior (Face)  Botox 50 units injected today to: - Frown complex 27.5 units - Forehead 7.5 units  - Crow's feet 7.5 units x 2   Start Tretinoin 0.025% qhs to face nightly  Topical retinoid medications like tretinoin/Retin-A, adapalene/Differin, tazarotene/Fabior, and Epiduo/Epiduo Forte can cause dryness and irritation when first started. Only apply a pea-sized amount to the entire affected area. Avoid applying it around the eyes, edges of mouth and creases at the nose. If you experience irritation, use a good moisturizer first and/or apply the medicine less often. If you are doing well with the medicine, you can increase how often you use it until you are applying every night. Be careful with sun protection while using this medication as it can make you sensitive to the sun. This medicine should not be used by pregnant women.    Botox Injection - Head - Anterior (Face) Location: frown complex, forehead, bil crow's feet  Informed consent: Discussed risks (infection, pain, bleeding, bruising, swelling, allergic reaction, paralysis of nearby  muscles, eyelid droop, double vision, neck weakness, difficulty breathing, headache, undesirable cosmetic result, and need for additional treatment) and benefits of the procedure, as well as the alternatives.  Informed consent was obtained.  Preparation: The area was cleansed with alcohol.  Procedure Details:  Botox was injected into the dermis with a 30-gauge needle. Pressure applied to any bleeding. Ice packs offered for swelling.  Lot Number:  [id number] Expiration:  03/26  Total Units Injected:  50  Plan: Patient was instructed to remain upright for 4 hours. Patient was instructed to avoid massaging the face and avoid vigorous exercise for the rest of the day. Tylenol may be used for headache.  Allow 2 weeks before returning to clinic for additional dosing as needed. Patient will call for any problems.   tretinoin (RETIN-A) 0.025 % cream - Head - Anterior (Face) Apply topically at bedtime. Apply a pea size amount to face nightly as tolerated   Return for 3-49m Botox.  I, Othelia Pulling, RMA, am acting as scribe for Sarina Ser, MD . Documentation: I have reviewed the above documentation for accuracy and completeness, and I agree with the above.  Sarina Ser, MD

## 2022-07-31 NOTE — Patient Instructions (Addendum)
Topical retinoid medications like tretinoin/Retin-A, adapalene/Differin, tazarotene/Fabior, and Epiduo/Epiduo Forte can cause dryness and irritation when first started. Only apply a pea-sized amount to the entire affected area. Avoid applying it around the eyes, edges of mouth and creases at the nose. If you experience irritation, use a good moisturizer first and/or apply the medicine less often. If you are doing well with the medicine, you can increase how often you use it until you are applying every night. Be careful with sun protection while using this medication as it can make you sensitive to the sun. This medicine should not be used by pregnant women.    Due to recent changes in healthcare laws, you may see results of your pathology and/or laboratory studies on MyChart before the doctors have had a chance to review them. We understand that in some cases there may be results that are confusing or concerning to you. Please understand that not all results are received at the same time and often the doctors may need to interpret multiple results in order to provide you with the best plan of care or course of treatment. Therefore, we ask that you please give us 2 business days to thoroughly review all your results before contacting the office for clarification. Should we see a critical lab result, you will be contacted sooner.   If You Need Anything After Your Visit  If you have any questions or concerns for your doctor, please call our main line at [phone] and press option 4 to reach your doctor's medical assistant. If no one answers, please leave a voicemail as directed and we will return your call as soon as possible. Messages left after 4 pm will be answered the following business day.   You may also send us a message via MyChart. We typically respond to MyChart messages within 1-2 business days.  For prescription refills, please ask your pharmacy to contact our office. Our fax number is  [phone].  If you have an urgent issue when the clinic is closed that cannot wait until the next business day, you can page your doctor at the number below.    Please note that while we do our best to be available for urgent issues outside of office hours, we are not available 24/7.   If you have an urgent issue and are unable to reach us, you may choose to seek medical care at your doctor's office, retail clinic, urgent care center, or emergency room.  If you have a medical emergency, please immediately call 911 or go to the emergency department.  Pager Numbers  - Dr. Kowalski: [phone]  - Dr. Moye: [phone]  - Dr. Stewart: [phone]  In the event of inclement weather, please call our main line at [phone] for an update on the status of any delays or closures.  Dermatology Medication Tips: Please keep the boxes that topical medications come in in order to help keep track of the instructions about where and how to use these. Pharmacies typically print the medication instructions only on the boxes and not directly on the medication tubes.   If your medication is too expensive, please contact our office at [phone] option 4 or send us a message through MyChart.   We are unable to tell what your co-pay for medications will be in advance as this is different depending on your insurance coverage. However, we may be able to find a substitute medication at lower cost or fill out paperwork to get insurance to cover a   needed medication.   If a prior authorization is required to get your medication covered by your insurance company, please allow us 1-2 business days to complete this process.  Drug prices often vary depending on where the prescription is filled and some pharmacies may offer cheaper prices.  The website www.goodrx.com contains coupons for medications through different pharmacies. The prices here do not account for what the cost may be with help from  insurance (it may be cheaper with your insurance), but the website can give you the price if you did not use any insurance.  - You can print the associated coupon and take it with your prescription to the pharmacy.  - You may also stop by our office during regular business hours and pick up a GoodRx coupon card.  - If you need your prescription sent electronically to a different pharmacy, notify our office through [facility] MyChart or by phone at [phone] option 4.     Si Usted Necesita Algo Despus de Su Visita  Tambin puede enviarnos un mensaje a travs de MyChart. Por lo general respondemos a los mensajes de MyChart en el transcurso de 1 a 2 das hbiles.  Para renovar recetas, por favor pida a su farmacia que se ponga en contacto con nuestra oficina. Nuestro nmero de fax es el [phone].  Si tiene un asunto urgente cuando la clnica est cerrada y que no puede esperar hasta el siguiente da hbil, puede llamar/localizar a su doctor(a) al nmero que aparece a continuacin.   Por favor, tenga en cuenta que aunque hacemos todo lo posible para estar disponibles para asuntos urgentes fuera del horario de oficina, no estamos disponibles las 24 horas del da, los 7 das de la semana.   Si tiene un problema urgente y no puede comunicarse con nosotros, puede optar por buscar atencin mdica  en el consultorio de su doctor(a), en una clnica privada, en un centro de atencin urgente o en una sala de emergencias.  Si tiene una emergencia mdica, por favor llame inmediatamente al 911 o vaya a la sala de emergencias.  Nmeros de bper  - Dr. Kowalski: [phone]  - Dra. Moye: [phone]  - Dra. Stewart: [phone]  En caso de inclemencias del tiempo, por favor llame a nuestra lnea principal al [phone] para una actualizacin sobre el estado de cualquier retraso o cierre.  Consejos para la medicacin en dermatologa: Por favor, guarde las cajas en las que vienen los  medicamentos de uso tpico para ayudarle a seguir las instrucciones sobre dnde y cmo usarlos. Las farmacias generalmente imprimen las instrucciones del medicamento slo en las cajas y no directamente en los tubos del medicamento.   Si su medicamento es muy caro, por favor, pngase en contacto con nuestra oficina llamando al [phone] y presione la opcin 4 o envenos un mensaje a travs de MyChart.   No podemos decirle cul ser su copago por los medicamentos por adelantado ya que esto es diferente dependiendo de la cobertura de su seguro. Sin embargo, es posible que podamos encontrar un medicamento sustituto a menor costo o llenar un formulario para que el seguro cubra el medicamento que se considera necesario.   Si se requiere una autorizacin previa para que su compaa de seguros cubra su medicamento, por favor permtanos de 1 a 2 das hbiles para completar este proceso.  Los precios de los medicamentos varan con frecuencia dependiendo del lugar de dnde se surte la receta y alguna farmacias pueden ofrecer precios ms baratos.    El sitio web www.goodrx.com tiene cupones para medicamentos de diferentes farmacias. Los precios aqu no tienen en cuenta lo que podra costar con la ayuda del seguro (puede ser ms barato con su seguro), pero el sitio web puede darle el precio si no utiliz ningn seguro.  - Puede imprimir el cupn correspondiente y llevarlo con su receta a la farmacia.  - Tambin puede pasar por nuestra oficina durante el horario de atencin regular y recoger una tarjeta de cupones de GoodRx.  - Si necesita que su receta se enve electrnicamente a una farmacia diferente, informe a nuestra oficina a travs de MyChart de [facility] o por telfono llamando al [phone] y presione la opcin 4.  

## 2022-08-01 ENCOUNTER — Encounter: Payer: Self-pay | Admitting: Dermatology

## 2022-12-04 ENCOUNTER — Ambulatory Visit: Payer: BC Managed Care – PPO | Admitting: Dermatology

## 2022-12-20 ENCOUNTER — Ambulatory Visit (INDEPENDENT_AMBULATORY_CARE_PROVIDER_SITE_OTHER): Payer: Self-pay | Admitting: Dermatology

## 2022-12-20 DIAGNOSIS — L988 Other specified disorders of the skin and subcutaneous tissue: Secondary | ICD-10-CM

## 2022-12-20 NOTE — Patient Instructions (Addendum)
Due to recent changes in healthcare laws, you may see results of your pathology and/or laboratory studies on MyChart before the doctors have had a chance to review them. We understand that in some cases there may be results that are confusing or concerning to you. Please understand that not all results are received at the same time and often the doctors may need to interpret multiple results in order to provide you with the best plan of care or course of treatment. Therefore, we ask that you please give us 2 business days to thoroughly review all your results before contacting the office for clarification. Should we see a critical lab result, you will be contacted sooner.   If You Need Anything After Your Visit  If you have any questions or concerns for your doctor, please call our main line at [phone] and press option 4 to reach your doctor's medical assistant. If no one answers, please leave a voicemail as directed and we will return your call as soon as possible. Messages left after 4 pm will be answered the following business day.   You may also send us a message via MyChart. We typically respond to MyChart messages within 1-2 business days.  For prescription refills, please ask your pharmacy to contact our office. Our fax number is [phone].  If you have an urgent issue when the clinic is closed that cannot wait until the next business day, you can page your doctor at the number below.    Please note that while we do our best to be available for urgent issues outside of office hours, we are not available 24/7.   If you have an urgent issue and are unable to reach us, you may choose to seek medical care at your doctor's office, retail clinic, urgent care center, or emergency room.  If you have a medical emergency, please immediately call 911 or go to the emergency department.  Pager Numbers  - Dr. Kowalski: [phone]  - Dr. Moye: [phone]  - Dr. Stewart:  [phone]  In the event of inclement weather, please call our main line at [phone] for an update on the status of any delays or closures.  Dermatology Medication Tips: Please keep the boxes that topical medications come in in order to help keep track of the instructions about where and how to use these. Pharmacies typically print the medication instructions only on the boxes and not directly on the medication tubes.   If your medication is too expensive, please contact our office at [phone] option 4 or send us a message through MyChart.   We are unable to tell what your co-pay for medications will be in advance as this is different depending on your insurance coverage. However, we may be able to find a substitute medication at lower cost or fill out paperwork to get insurance to cover a needed medication.   If a prior authorization is required to get your medication covered by your insurance company, please allow us 1-2 business days to complete this process.  Drug prices often vary depending on where the prescription is filled and some pharmacies may offer cheaper prices.  The website www.goodrx.com contains coupons for medications through different pharmacies. The prices here do not account for what the cost may be with help from insurance (it may be cheaper with your insurance), but the website can give you the price if you did not use any insurance.  - You can print the associated coupon and take it with   your prescription to the pharmacy.  - You may also stop by our office during regular business hours and pick up a GoodRx coupon card.  - If you need your prescription sent electronically to a different pharmacy, notify our office through [facility] MyChart or by phone at [phone] option 4.     Si Usted Necesita Algo Despus de Su Visita  Tambin puede enviarnos un mensaje a travs de MyChart. Por lo general respondemos a los mensajes de MyChart en el transcurso de 1 a 2  das hbiles.  Para renovar recetas, por favor pida a su farmacia que se ponga en contacto con nuestra oficina. Nuestro nmero de fax es el [phone].  Si tiene un asunto urgente cuando la clnica est cerrada y que no puede esperar hasta el siguiente da hbil, puede llamar/localizar a su doctor(a) al nmero que aparece a continuacin.   Por favor, tenga en cuenta que aunque hacemos todo lo posible para estar disponibles para asuntos urgentes fuera del horario de oficina, no estamos disponibles las 24 horas del da, los 7 das de la semana.   Si tiene un problema urgente y no puede comunicarse con nosotros, puede optar por buscar atencin mdica  en el consultorio de su doctor(a), en una clnica privada, en un centro de atencin urgente o en una sala de emergencias.  Si tiene una emergencia mdica, por favor llame inmediatamente al 911 o vaya a la sala de emergencias.  Nmeros de bper  - Dr. Kowalski: [phone]  - Dra. Moye: [phone]  - Dra. Stewart: [phone]  En caso de inclemencias del tiempo, por favor llame a nuestra lnea principal al [phone] para una actualizacin sobre el estado de cualquier retraso o cierre.  Consejos para la medicacin en dermatologa: Por favor, guarde las cajas en las que vienen los medicamentos de uso tpico para ayudarle a seguir las instrucciones sobre dnde y cmo usarlos. Las farmacias generalmente imprimen las instrucciones del medicamento slo en las cajas y no directamente en los tubos del medicamento.   Si su medicamento es muy caro, por favor, pngase en contacto con nuestra oficina llamando al [phone] y presione la opcin 4 o envenos un mensaje a travs de MyChart.   No podemos decirle cul ser su copago por los medicamentos por adelantado ya que esto es diferente dependiendo de la cobertura de su seguro. Sin embargo, es posible que podamos encontrar un medicamento sustituto a menor costo o llenar un formulario para que el  seguro cubra el medicamento que se considera necesario.   Si se requiere una autorizacin previa para que su compaa de seguros cubra su medicamento, por favor permtanos de 1 a 2 das hbiles para completar este proceso.  Los precios de los medicamentos varan con frecuencia dependiendo del lugar de dnde se surte la receta y alguna farmacias pueden ofrecer precios ms baratos.  El sitio web www.goodrx.com tiene cupones para medicamentos de diferentes farmacias. Los precios aqu no tienen en cuenta lo que podra costar con la ayuda del seguro (puede ser ms barato con su seguro), pero el sitio web puede darle el precio si no utiliz ningn seguro.  - Puede imprimir el cupn correspondiente y llevarlo con su receta a la farmacia.  - Tambin puede pasar por nuestra oficina durante el horario de atencin regular y recoger una tarjeta de cupones de GoodRx.  - Si necesita que su receta se enve electrnicamente a una farmacia diferente, informe a nuestra oficina a travs de MyChart de [facility]   o por telfono llamando al [phone] y presione la opcin 4.  

## 2022-12-20 NOTE — Progress Notes (Signed)
   Follow-Up Visit   Subjective  Melanie Ford is a 57 y.o. female who presents for the following: Botox for facial elastosis  The following portions of the chart were reviewed this encounter and updated as appropriate: medications, allergies, medical history  Review of Systems:  No other skin or systemic complaints except as noted in HPI or Assessment and Plan.  Objective  Well appearing patient in no apparent distress; mood and affect are within normal limits.  A focused examination was performed of the face.  Relevant physical exam findings are noted in the Assessment and Plan.      Assessment & Plan   Botox 50 units injected today to: - Frown complex 27.5 units - Forehead 7.5 units  - Crow's feet 7.5 units x 2  Facial Elastosis  Location: See attached image  Informed consent: Discussed risks (infection, pain, bleeding, bruising, swelling, allergic reaction, paralysis of nearby muscles, eyelid droop, double vision, neck weakness, difficulty breathing, headache, undesirable cosmetic result, and need for additional treatment) and benefits of the procedure, as well as the alternatives.  Informed consent was obtained.  Preparation: The area was cleansed with alcohol.  Procedure Details:  Botox was injected into the dermis with a 30-gauge needle. Pressure applied to any bleeding. Ice packs offered for swelling.  Lot Number:  [id number] Expiration:  06/26  Total Units Injected:  50  Plan: Tylenol may be used for headache.  Allow 2 weeks before returning to clinic for additional dosing as needed. Patient will call for any problems.  Return in about 4 months (around 04/21/2023) for Botox injections.  Maylene Roes, CMA, am acting as scribe for Armida Sans, MD .  Documentation: I have reviewed the above documentation for accuracy and completeness, and I agree with the above.  Armida Sans, MD

## 2022-12-22 ENCOUNTER — Encounter: Payer: Self-pay | Admitting: Dermatology

## 2023-04-09 ENCOUNTER — Other Ambulatory Visit: Payer: Self-pay | Admitting: Internal Medicine

## 2023-04-09 DIAGNOSIS — Z1231 Encounter for screening mammogram for malignant neoplasm of breast: Secondary | ICD-10-CM

## 2023-05-14 ENCOUNTER — Ambulatory Visit: Payer: BC Managed Care – PPO | Admitting: Dermatology

## 2023-05-14 DIAGNOSIS — L820 Inflamed seborrheic keratosis: Secondary | ICD-10-CM

## 2023-05-14 DIAGNOSIS — L988 Other specified disorders of the skin and subcutaneous tissue: Secondary | ICD-10-CM

## 2023-05-14 DIAGNOSIS — D1801 Hemangioma of skin and subcutaneous tissue: Secondary | ICD-10-CM

## 2023-05-14 DIAGNOSIS — Z1283 Encounter for screening for malignant neoplasm of skin: Secondary | ICD-10-CM | POA: Diagnosis not present

## 2023-05-14 DIAGNOSIS — D229 Melanocytic nevi, unspecified: Secondary | ICD-10-CM

## 2023-05-14 DIAGNOSIS — L578 Other skin changes due to chronic exposure to nonionizing radiation: Secondary | ICD-10-CM

## 2023-05-14 DIAGNOSIS — L814 Other melanin hyperpigmentation: Secondary | ICD-10-CM

## 2023-05-14 DIAGNOSIS — L2089 Other atopic dermatitis: Secondary | ICD-10-CM

## 2023-05-14 DIAGNOSIS — L209 Atopic dermatitis, unspecified: Secondary | ICD-10-CM

## 2023-05-14 DIAGNOSIS — L821 Other seborrheic keratosis: Secondary | ICD-10-CM

## 2023-05-14 DIAGNOSIS — L813 Cafe au lait spots: Secondary | ICD-10-CM

## 2023-05-14 DIAGNOSIS — L700 Acne vulgaris: Secondary | ICD-10-CM

## 2023-05-14 DIAGNOSIS — Z7189 Other specified counseling: Secondary | ICD-10-CM

## 2023-05-14 DIAGNOSIS — W908XXA Exposure to other nonionizing radiation, initial encounter: Secondary | ICD-10-CM

## 2023-05-14 DIAGNOSIS — Z79899 Other long term (current) drug therapy: Secondary | ICD-10-CM

## 2023-05-14 MED ORDER — EUCRISA 2 % EX OINT: TOPICAL_OINTMENT | CUTANEOUS | 4 refills | Status: AC

## 2023-05-14 NOTE — Patient Instructions (Addendum)

## 2023-05-14 NOTE — Progress Notes (Signed)
Follow-Up Visit   Subjective  Melanie Ford is a 57 y.o. female who presents for the following: Skin Cancer Screening and Full Body Skin Exam No history of skin cancer.  Patient mentioned a bump at face, a spot at left arm and age spots at legs.   Patient also here for 4 month botox follow up  The patient presents for Total-Body Skin Exam (TBSE) for skin cancer screening and mole check. The patient has spots, moles and lesions to be evaluated, some may be new or changing and the patient may have concern these could be cancer.  The following portions of the chart were reviewed this encounter and updated as appropriate: medications, allergies, medical history  Review of Systems:  No other skin or systemic complaints except as noted in HPI or Assessment and Plan.  Objective  Well appearing patient in no apparent distress; mood and affect are within normal limits.  A full examination was performed including scalp, head, eyes, ears, nose, lips, neck, chest, axillae, abdomen, back, buttocks, bilateral upper extremities, bilateral lower extremities, hands, feet, fingers, toes, fingernails, and toenails. All findings within normal limits unless otherwise noted below.   Relevant physical exam findings are noted in the Assessment and Plan.  forehead x 3, right arm x 1, right forearm x 1, left forearm x 1,  legs x 3, back x 11 (20) Erythematous stuck-on, waxy papule or plaque    Assessment & Plan   SKIN CANCER SCREENING PERFORMED TODAY.  ACTINIC DAMAGE - Chronic condition, secondary to cumulative UV/sun exposure - diffuse scaly erythematous macules with underlying dyspigmentation - Recommend daily broad spectrum sunscreen SPF 30+ to sun-exposed areas, reapply every 2 hours as needed.  - Staying in the shade or wearing long sleeves, sun glasses (UVA+UVB protection) and wide brim hats (4-inch brim around the entire circumference of the hat) are also recommended for sun protection.  - Call  for new or changing lesions.  LENTIGINES, SEBORRHEIC KERATOSES, HEMANGIOMAS - Benign normal skin lesions - Benign-appearing - Call for any changes  Cafe au Lait  At left anterior waistline  - Tan patch - Genetic - Benign, observe - Call for any changes  MELANOCYTIC NEVI - Tan-brown and/or pink-flesh-colored symmetric macules and papules - Benign appearing on exam today - Observation - Call clinic for new or changing moles - Recommend daily use of broad spectrum spf 30+ sunscreen to sun-exposed areas.   FACIAL ELASTOSIS Exam: Rhytides and volume loss.  Treatment Plan: Rhytides and volume loss.   Recommend daily broad spectrum sunscreen SPF 30+ to sun-exposed areas, reapply every 2 hours as needed. Call for new or changing lesions.  Staying in the shade or wearing long sleeves, sun glasses (UVA+UVB protection) and wide brim hats (4-inch brim around the entire circumference of the hat) are also recommended for sun protection.   Facial Elastosis  Location:   Informed consent: Discussed risks (infection, pain, bleeding, bruising, swelling, allergic reaction, paralysis of nearby muscles, eyelid droop, double vision, neck weakness, difficulty breathing, headache, undesirable cosmetic result, and need for additional treatment) and benefits of the procedure, as well as the alternatives.  Informed consent was obtained.  Preparation: The area was cleansed with alcohol.  Procedure Details:  Botox was injected into the dermis with a 30-gauge needle. Pressure applied to any bleeding. Ice packs offered for swelling.  Lot Number:  [id number] Expiration:  02/2025  Frown complex 27.5 units Forehead 7.5 units Crows feet 7.5 x 2   Total Units Injected:  50 units   Plan: Tylenol may be used for headache.  Allow 2 weeks before returning to clinic for additional dosing as needed. Patient will call for any problems.  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 Condition is symptomatic/ bothersome to patient. Not currently at goal.   Treatment Plan: Sample of Epsolay Cream given to use at affected area nightly as needd  [facility] 6213-0865-78 Lot: [id number] 08/2023  ATOPIC DERMATITIS of hands Exam: Scaly pink papules coalescing to plaques  2% BSA  Chronic and persistent condition with duration or expected duration over one year. Condition is bothersome/symptomatic for patient. Currently flared.   Atopic dermatitis (eczema) is a chronic, relapsing, pruritic condition that can significantly affect quality of life. It is often associated with allergic rhinitis and/or asthma and can require treatment with topical medications, phototherapy, or in severe cases biologic injectable medication (Dupixent; Adbry) or Oral JAK inhibitors.  Treatment Plan:  Start eucrisa ointment - use once nightly to affected areas of hands as needed.  Samples given Rx sent  Patient advised if insurance does not cover can consider other topical treatments.  Recommend otc Eucerin moisturizer to apply daily to affected areas samples given   Recommend gentle skin care.  Atopic dermatitis, unspecified type  Related Medications Crisaborole (EUCRISA) 2 % OINT Apply topically to affected areas of hands nightly as needed for atopic dermatitis  Inflamed seborrheic keratosis (20) forehead x 3, right arm x 1, right forearm x 1, left forearm x 1,  legs x 3, back x 11  Symptomatic, irritating, patient would like treated.  Destruction of lesion - forehead x 3, right arm x 1, right forearm x 1, left forearm x 1,  legs x 3, back x 11 (20) Complexity: simple   Destruction method: cryotherapy   Informed consent: discussed and consent obtained   Timeout:  patient name, date of birth, surgical site, and procedure verified Lesion destroyed using liquid nitrogen: Yes   Region frozen until ice ball extended beyond lesion: Yes   Outcome:  patient tolerated procedure well with no complications   Post-procedure details: wound care instructions given     Return in about 1 year (around 05/13/2024) for TBSE and 4 - 5 month botox follow up.  IAsher Muir, CMA, am acting as scribe for Armida Sans, MD.   Documentation: I have reviewed the above documentation for accuracy and completeness, and I agree with the above.  Armida Sans, MD

## 2023-05-19 ENCOUNTER — Encounter: Payer: Self-pay | Admitting: Dermatology

## 2023-05-27 ENCOUNTER — Ambulatory Visit
Admission: RE | Admit: 2023-05-27 | Discharge: 2023-05-27 | Disposition: A | Discharge status: Home / Self Care | Payer: BC Managed Care – PPO | Source: Ambulatory Visit | Attending: Internal Medicine | Admitting: Internal Medicine

## 2023-05-27 DIAGNOSIS — Z1231 Encounter for screening mammogram for malignant neoplasm of breast: Secondary | ICD-10-CM | POA: Insufficient documentation

## 2023-09-17 ENCOUNTER — Ambulatory Visit (INDEPENDENT_AMBULATORY_CARE_PROVIDER_SITE_OTHER): Payer: Self-pay | Admitting: Dermatology

## 2023-09-17 ENCOUNTER — Encounter: Payer: Self-pay | Admitting: Dermatology

## 2023-09-17 DIAGNOSIS — L988 Other specified disorders of the skin and subcutaneous tissue: Secondary | ICD-10-CM

## 2023-09-17 NOTE — Progress Notes (Signed)
   Follow-Up Visit   Subjective  Melanie Ford is a 58 y.o. female who presents for the following: Botox for facial elastosis  The following portions of the chart were reviewed this encounter and updated as appropriate: medications, allergies, medical history  Review of Systems:  No other skin or systemic complaints except as noted in HPI or Assessment and Plan.  Objective  Well appearing patient in no apparent distress; mood and affect are within normal limits.  A focused examination was performed of the face.  Relevant physical exam findings are noted in the Assessment and Plan.  Before Injection photos      Injection map photo     Assessment & Plan    Facial Elastosis Discussed adding injections for brow lift  Botox 55 units injected today to: - Frown complex 27.5 units - Forehead 7.5 units  - Crow's feet 7.5 units x 2 - Brow lift 2.5 units x 2  Location: frown complex, forehead, crow's feet  Informed consent: Discussed risks (infection, pain, bleeding, bruising, swelling, allergic reaction, paralysis of nearby muscles, eyelid droop, double vision, neck weakness, difficulty breathing, headache, undesirable cosmetic result, and need for additional treatment) and benefits of the procedure, as well as the alternatives.  Informed consent was obtained.  Preparation: The area was cleansed with alcohol.  Procedure Details:  Botox was injected into the dermis with a 30-gauge needle. Pressure applied to any bleeding. Ice packs offered for swelling.  Lot Number:  [id number] Expiration:  07/2025  Total Units Injected:  55  Plan: Tylenol may be used for headache.  Allow 2 weeks before returning to clinic for additional dosing as needed. Patient will call for any problems.  Return for 3-51m Botox.  I, Ardis Rowan, RMA, am acting as scribe for Armida Sans, MD .   Documentation: I have reviewed the above documentation for accuracy and completeness, and I agree with the  above.  Armida Sans, MD

## 2023-09-17 NOTE — Patient Instructions (Signed)

## 2023-12-31 ENCOUNTER — Encounter: Payer: Self-pay | Admitting: Dermatology

## 2023-12-31 ENCOUNTER — Ambulatory Visit (INDEPENDENT_AMBULATORY_CARE_PROVIDER_SITE_OTHER): Payer: Self-pay | Admitting: Dermatology

## 2023-12-31 DIAGNOSIS — L988 Other specified disorders of the skin and subcutaneous tissue: Secondary | ICD-10-CM

## 2023-12-31 NOTE — Patient Instructions (Signed)

## 2023-12-31 NOTE — Progress Notes (Signed)
   Follow-Up Visit   Subjective  NALEA SALCE is a 58 y.o. female who presents for the following: Botox for facial elastosis  The following portions of the chart were reviewed this encounter and updated as appropriate: medications, allergies, medical history  Review of Systems:  No other skin or systemic complaints except as noted in HPI or Assessment and Plan.  Objective  Well appearing patient in no apparent distress; mood and affect are within normal limits.  A focused examination was performed of the face.  Relevant physical exam findings are noted in the Assessment and Plan.   Injection map photo     Assessment & Plan    Facial Elastosis Botox 55 units Frown Complex 27.5 units Brow Lift 2.5 x 2 units Forehead 7.5 units Crows feet 7.5 units x 2  Location: See attached image  Informed consent: Discussed risks (infection, pain, bleeding, bruising, swelling, allergic reaction, paralysis of nearby muscles, eyelid droop, double vision, neck weakness, difficulty breathing, headache, undesirable cosmetic result, and need for additional treatment) and benefits of the procedure, as well as the alternatives.  Informed consent was obtained.  Preparation: The area was cleansed with alcohol.  Procedure Details:  Botox was injected into the dermis with a 30-gauge needle. Pressure applied to any bleeding. Ice packs offered for swelling.  Lot Number:  [id number]  Expiration:  10/2025  Total Units Injected:  55  Plan: Tylenol may be used for headache.  Allow 2 weeks before returning to clinic for additional dosing as needed. Patient will call for any problems.  Return for 3 - 4 month botox .  IEleanor Blush, CMA, am acting as scribe for Alm Rhyme, MD.   Documentation: I have reviewed the above documentation for accuracy and completeness, and I agree with the above.  Alm Rhyme, MD

## 2024-03-13 ENCOUNTER — Other Ambulatory Visit: Payer: Self-pay | Admitting: Dermatology

## 2024-04-07 ENCOUNTER — Ambulatory Visit: Payer: Self-pay

## 2024-04-07 DIAGNOSIS — Z1211 Encounter for screening for malignant neoplasm of colon: Secondary | ICD-10-CM | POA: Diagnosis present

## 2024-04-07 DIAGNOSIS — K5730 Diverticulosis of large intestine without perforation or abscess without bleeding: Secondary | ICD-10-CM | POA: Diagnosis not present

## 2024-04-07 DIAGNOSIS — K640 First degree hemorrhoids: Secondary | ICD-10-CM | POA: Diagnosis not present

## 2024-05-12 ENCOUNTER — Encounter: Payer: Self-pay | Admitting: Dermatology

## 2024-05-12 ENCOUNTER — Ambulatory Visit (INDEPENDENT_AMBULATORY_CARE_PROVIDER_SITE_OTHER): Payer: Self-pay | Admitting: Dermatology

## 2024-05-12 DIAGNOSIS — L988 Other specified disorders of the skin and subcutaneous tissue: Secondary | ICD-10-CM

## 2024-05-12 NOTE — Patient Instructions (Signed)

## 2024-05-12 NOTE — Progress Notes (Unsigned)
   Follow-Up Visit   Subjective  Melanie Ford is a 58 y.o. female who presents for the following: Botox for facial elastosis  The following portions of the chart were reviewed this encounter and updated as appropriate: medications, allergies, medical history  Review of Systems:  No other skin or systemic complaints except as noted in HPI or Assessment and Plan.  Objective  Well appearing patient in no apparent distress; mood and affect are within normal limits.  A focused examination was performed of the face.  Relevant physical exam findings are noted in the Assessment and Plan.  Injection map photo     Assessment & Plan    Facial Elastosis Botox 55 units injected today to: - Frown Complex 27.5 units - Brow lift 2.5 units x 2 - Forehead 7.5 units - Crow's feet 7.5 units x 2  Location: frown complex, brow lift, forehead, crow's feet  Informed consent: Discussed risks (infection, pain, bleeding, bruising, swelling, allergic reaction, paralysis of nearby muscles, eyelid droop, double vision, neck weakness, difficulty breathing, headache, undesirable cosmetic result, and need for additional treatment) and benefits of the procedure, as well as the alternatives.  Informed consent was obtained.  Preparation: The area was cleansed with alcohol.  Procedure Details:  Botox was injected into the dermis with a 30-gauge needle. Pressure applied to any bleeding. Ice packs offered for swelling.  Lot Number:  [id number] Expiration:  03/2026  Total Units Injected:  55  Plan: Tylenol may be used for headache.  Allow 2 weeks before returning to clinic for additional dosing as needed. Patient will call for any problems.  Return for 3-36m Botox.  I, Grayce Saunas, RMA, am acting as scribe for Alm Rhyme, MD .   Documentation: I have reviewed the above documentation for accuracy and completeness, and I agree with the above.  Alm Rhyme, MD

## 2024-05-13 ENCOUNTER — Encounter: Payer: Self-pay | Admitting: Dermatology

## 2024-09-10 ENCOUNTER — Ambulatory Visit: Admitting: Dermatology
